# Patient Record
Sex: Male | Born: 1955 | Race: Black or African American | Hispanic: No | Marital: Married | Smoking: Former smoker
Health system: Southern US, Community
[De-identification: ages and names within clinical notes are randomized; demographics above are authoritative.]

## PROBLEM LIST (undated history)

## (undated) DIAGNOSIS — I10 Essential (primary) hypertension: Secondary | ICD-10-CM

---

## 2009-01-31 ENCOUNTER — Emergency Department (HOSPITAL_COMMUNITY): Admission: EM | Admit: 2009-01-31 | Discharge: 2009-02-01 | Payer: Self-pay | Admitting: Emergency Medicine

## 2009-02-07 ENCOUNTER — Ambulatory Visit (HOSPITAL_COMMUNITY): Admission: RE | Admit: 2009-02-07 | Discharge: 2009-02-07 | Payer: Self-pay | Admitting: Orthopedic Surgery

## 2010-02-20 ENCOUNTER — Emergency Department (HOSPITAL_COMMUNITY): Admission: EM | Admit: 2010-02-20 | Discharge: 2010-02-20 | Payer: Self-pay | Admitting: Family Medicine

## 2010-08-16 LAB — POCT I-STAT, CHEM 8
Creatinine, Ser: 1.2 mg/dL (ref 0.4–1.5)
HCT: 45 % (ref 39.0–52.0)
Hemoglobin: 15.3 g/dL (ref 13.0–17.0)
Sodium: 140 mEq/L (ref 135–145)
TCO2: 23 mmol/L (ref 0–100)

## 2010-09-07 LAB — CBC
MCHC: 35.1 g/dL (ref 30.0–36.0)
RBC: 4.03 MIL/uL — ABNORMAL LOW (ref 4.22–5.81)

## 2014-05-25 ENCOUNTER — Encounter (HOSPITAL_COMMUNITY): Payer: Self-pay | Admitting: Emergency Medicine

## 2014-05-25 ENCOUNTER — Emergency Department (HOSPITAL_COMMUNITY)
Admission: EM | Admit: 2014-05-25 | Discharge: 2014-05-25 | Disposition: A | Discharge status: Home / Self Care | Payer: Self-pay | Attending: Emergency Medicine | Admitting: Emergency Medicine

## 2014-05-25 ENCOUNTER — Emergency Department (HOSPITAL_COMMUNITY): Payer: Self-pay

## 2014-05-25 DIAGNOSIS — Z87891 Personal history of nicotine dependence: Secondary | ICD-10-CM | POA: Insufficient documentation

## 2014-05-25 DIAGNOSIS — S8991XA Unspecified injury of right lower leg, initial encounter: Secondary | ICD-10-CM | POA: Insufficient documentation

## 2014-05-25 DIAGNOSIS — Y998 Other external cause status: Secondary | ICD-10-CM | POA: Insufficient documentation

## 2014-05-25 DIAGNOSIS — Y9289 Other specified places as the place of occurrence of the external cause: Secondary | ICD-10-CM | POA: Insufficient documentation

## 2014-05-25 DIAGNOSIS — W01198A Fall on same level from slipping, tripping and stumbling with subsequent striking against other object, initial encounter: Secondary | ICD-10-CM | POA: Insufficient documentation

## 2014-05-25 DIAGNOSIS — Y9301 Activity, walking, marching and hiking: Secondary | ICD-10-CM | POA: Insufficient documentation

## 2014-05-25 DIAGNOSIS — I10 Essential (primary) hypertension: Secondary | ICD-10-CM | POA: Insufficient documentation

## 2014-05-25 DIAGNOSIS — M25569 Pain in unspecified knee: Secondary | ICD-10-CM

## 2014-05-25 HISTORY — DX: Essential (primary) hypertension: I10

## 2014-05-25 MED ORDER — NAPROXEN 500 MG PO TABS: 500.0000 mg | ORAL_TABLET | Freq: Two times a day (BID) | ORAL | Status: DC

## 2014-05-25 MED ORDER — HYDROCHLOROTHIAZIDE 25 MG PO TABS: 25.0000 mg | ORAL_TABLET | Freq: Every day | ORAL | Status: DC

## 2014-05-25 NOTE — ED Notes (Signed)
Pt to radiology.

## 2014-05-25 NOTE — ED Notes (Signed)
Pt ambulatory with slow steady gait to void in BR 

## 2014-05-25 NOTE — Discharge Instructions (Signed)
Knee Pain The knee is the complex joint between your thigh and your lower leg. It is made up of bones, tendons, ligaments, and cartilage. The bones that make up the knee are:  The femur in the thigh.  The tibia and fibula in the lower leg.  The patella or kneecap riding in the groove on the lower femur. CAUSES  Knee pain is a common complaint with many causes. A few of these causes are:  Injury, such as:  A ruptured ligament or tendon injury.  Torn cartilage.  Medical conditions, such as:  Gout  Arthritis  Infections  Overuse, over training, or overdoing a physical activity. Knee pain can be minor or severe. Knee pain can accompany debilitating injury. Minor knee problems often respond well to self-care measures or get well on their own. More serious injuries may need medical intervention or even surgery. SYMPTOMS The knee is complex. Symptoms of knee problems can vary widely. Some of the problems are:  Pain with movement and weight bearing.  Swelling and tenderness.  Buckling of the knee.  Inability to straighten or extend your knee.  Your knee locks and you cannot straighten it.  Warmth and redness with pain and fever.  Deformity or dislocation of the kneecap. DIAGNOSIS  Determining what is wrong may be very straight forward such as when there is an injury. It can also be challenging because of the complexity of the knee. Tests to make a diagnosis may include:  Your caregiver taking a history and doing a physical exam.  Routine X-rays can be used to rule out other problems. X-rays will not reveal a cartilage tear. Some injuries of the knee can be diagnosed by:  Arthroscopy a surgical technique by which a small video camera is inserted through tiny incisions on the sides of the knee. This procedure is used to examine and repair internal knee joint problems. Tiny instruments can be used during arthroscopy to repair the torn knee cartilage (meniscus).  Arthrography  is a radiology technique. A contrast liquid is directly injected into the knee joint. Internal structures of the knee joint then become visible on X-ray film.  An MRI scan is a non X-ray radiology procedure in which magnetic fields and a computer produce two- or three-dimensional images of the inside of the knee. Cartilage tears are often visible using an MRI scanner. MRI scans have largely replaced arthrography in diagnosing cartilage tears of the knee.  Blood work.  Examination of the fluid that helps to lubricate the knee joint (synovial fluid). This is done by taking a sample out using a needle and a syringe. TREATMENT The treatment of knee problems depends on the cause. Some of these treatments are:  Depending on the injury, proper casting, splinting, surgery, or physical therapy care will be needed.  Give yourself adequate recovery time. Do not overuse your joints. If you begin to get sore during workout routines, back off. Slow down or do fewer repetitions.  For repetitive activities such as cycling or running, maintain your strength and nutrition.  Alternate muscle groups. For example, if you are a weight lifter, work the upper body on one day and the lower body the next.  Either tight or weak muscles do not give the proper support for your knee. Tight or weak muscles do not absorb the stress placed on the knee joint. Keep the muscles surrounding the knee strong.  Take care of mechanical problems.  If you have flat feet, orthotics or special shoes may help.  See your caregiver if you need help. °· Arch supports, sometimes with wedges on the inner or outer aspect of the heel, can help. These can shift pressure away from the side of the knee most bothered by osteoarthritis. °· A brace called an "unloader" brace also may be used to help ease the pressure on the most arthritic side of the knee. °· If your caregiver has prescribed crutches, braces, wraps or ice, use as directed. The acronym  for this is PRICE. This means protection, rest, ice, compression, and elevation. °· Nonsteroidal anti-inflammatory drugs (NSAIDs), can help relieve pain. But if taken immediately after an injury, they may actually increase swelling. Take NSAIDs with food in your stomach. Stop them if you develop stomach problems. Do not take these if you have a history of ulcers, stomach pain, or bleeding from the bowel. Do not take without your caregiver's approval if you have problems with fluid retention, heart failure, or kidney problems. °· For ongoing knee problems, physical therapy may be helpful. °· Glucosamine and chondroitin are over-the-counter dietary supplements. Both may help relieve the pain of osteoarthritis in the knee. These medicines are different from the usual anti-inflammatory drugs. Glucosamine may decrease the rate of cartilage destruction. °· Injections of a corticosteroid drug into your knee joint may help reduce the symptoms of an arthritis flare-up. They may provide pain relief that lasts a few months. You may have to wait a few months between injections. The injections do have a small increased risk of infection, water retention, and elevated blood sugar levels. °· Hyaluronic acid injected into damaged joints may ease pain and provide lubrication. These injections may work by reducing inflammation. A series of shots may give relief for as long as 6 months. °· Topical painkillers. Applying certain ointments to your skin may help relieve the pain and stiffness of osteoarthritis. Ask your pharmacist for suggestions. Many over the-counter products are approved for temporary relief of arthritis pain. °· In some countries, doctors often prescribe topical NSAIDs for relief of chronic conditions such as arthritis and tendinitis. A review of treatment with NSAID creams found that they worked as well as oral medications but without the serious side effects. °PREVENTION °· Maintain a healthy weight. Extra pounds  put more strain on your joints. °· Get strong, stay limber. Weak muscles are a common cause of knee injuries. Stretching is important. Include flexibility exercises in your workouts. °· Be smart about exercise. If you have osteoarthritis, chronic knee pain or recurring injuries, you may need to change the way you exercise. This does not mean you have to stop being active. If your knees ache after jogging or playing basketball, consider switching to swimming, water aerobics, or other low-impact activities, at least for a few days a week. Sometimes limiting high-impact activities will provide relief. °· Make sure your shoes fit well. Choose footwear that is right for your sport. °· Protect your knees. Use the proper gear for knee-sensitive activities. Use kneepads when playing volleyball or laying carpet. Buckle your seat belt every time you drive. Most shattered kneecaps occur in car accidents. °· Rest when you are tired. °SEEK MEDICAL CARE IF:  °You have knee pain that is continual and does not seem to be getting better.  °SEEK IMMEDIATE MEDICAL CARE IF:  °Your knee joint feels hot to the touch and you have a high fever. °MAKE SURE YOU:  °· Understand these instructions. °· Will watch your condition. °· Will get help right away if you are not   doing well or get worse. Document Released: 03/17/2007 Document Revised: 08/12/2011 Document Reviewed: 03/17/2007 [facility] Patient Information 2015 [place], [place]. This information is not intended to replace advice given to you by your health care provider. Make sure you discuss any questions you have with your health care provider.  Hypertension Hypertension, commonly called high blood pressure, is when the force of blood pumping through your arteries is too strong. Your arteries are the blood vessels that carry blood from your heart throughout your body. A blood pressure reading consists of a higher number over a lower number, such as 110/72. The higher number  (systolic) is the pressure inside your arteries when your heart pumps. The lower number (diastolic) is the pressure inside your arteries when your heart relaxes. Ideally you want your blood pressure below 120/80. Hypertension forces your heart to work harder to pump blood. Your arteries may become narrow or stiff. Having hypertension puts you at risk for heart disease, stroke, and other problems.  RISK FACTORS Some risk factors for high blood pressure are controllable. Others are not.  Risk factors you cannot control include:   Race. You may be at higher risk if you are African American.  Age. Risk increases with age.  Gender. Men are at higher risk than women before age 57 years. After age 565, women are at higher risk than men. Risk factors you can control include:  Not getting enough exercise or physical activity.  Being overweight.  Getting too much fat, sugar, calories, or salt in your diet.  Drinking too much alcohol. SIGNS AND SYMPTOMS Hypertension does not usually cause signs or symptoms. Extremely high blood pressure (hypertensive crisis) may cause headache, anxiety, shortness of breath, and nosebleed. DIAGNOSIS  To check if you have hypertension, your health care provider will measure your blood pressure while you are seated, with your arm held at the level of your heart. It should be measured at least twice using the same arm. Certain conditions can cause a difference in blood pressure between your right and left arms. A blood pressure reading that is higher than normal on one occasion does not mean that you need treatment. If one blood pressure reading is high, ask your health care provider about having it checked again. TREATMENT  Treating high blood pressure includes making lifestyle changes and possibly taking medicine. Living a healthy lifestyle can help lower high blood pressure. You may need to change some of your habits. Lifestyle changes may include:  Following the DASH  diet. This diet is high in fruits, vegetables, and whole grains. It is low in salt, red meat, and added sugars.  Getting at least 2 hours of brisk physical activity every week.  Losing weight if necessary.  Not smoking.  Limiting alcoholic beverages.  Learning ways to reduce stress. If lifestyle changes are not enough to get your blood pressure under control, your health care provider may prescribe medicine. You may need to take more than one. Work closely with your health care provider to understand the risks and benefits. HOME CARE INSTRUCTIONS  Have your blood pressure rechecked as directed by your health care provider.   Take medicines only as directed by your health care provider. Follow the directions carefully. Blood pressure medicines must be taken as prescribed. The medicine does not work as well when you skip doses. Skipping doses also puts you at risk for problems.   Do not smoke.   Monitor your blood pressure at home as directed by your health care provider.  SEEK MEDICAL CARE IF:   You think you are having a reaction to medicines taken.  You have recurrent headaches or feel dizzy.  You have swelling in your ankles.  You have trouble with your vision. SEEK IMMEDIATE MEDICAL CARE IF:  You develop a severe headache or confusion.  You have unusual weakness, numbness, or feel faint.  You have severe chest or abdominal pain.  You vomit repeatedly.  You have trouble breathing. MAKE SURE YOU:   Understand these instructions.  Will watch your condition.  Will get help right away if you are not doing well or get worse. Document Released: 05/20/2005 Document Revised: 10/04/2013 Document Reviewed: 03/12/2013 [facility] Patient Information 2015 [place]Care, [place]. This information is not intended to replace advice given to you by your health care provider. Make sure you discuss any questions you have with your health care provider.   Emergency Department Resource  Guide 1) Find a Doctor and Pay Out of Pocket Although you won't have to find out who is covered by your insurance plan, it is a good idea to ask around and get recommendations. You will then need to call the office and see if the doctor you have chosen will accept you as a new patient and what types of options they offer for patients who are self-pay. Some doctors offer discounts or will set up payment plans for their patients who do not have insurance, but you will need to ask so you aren't surprised when you get to your appointment.  2) Contact Your Local Health Department Not all health departments have doctors that can see patients for sick visits, but many do, so it is worth a call to see if yours does. If you don't know where your local health department is, you can check in your phone book. The CDC also has a tool to help you locate your state's health department, and many state websites also have listings of all of their local health departments.  3) Find a Walk-in Clinic If your illness is not likely to be very severe or complicated, you may want to try a walk in clinic. These are popping up all over the country in pharmacies, drugstores, and shopping centers. They're usually staffed by nurse practitioners or physician assistants that have been trained to treat common illnesses and complaints. They're usually fairly quick and inexpensive. However, if you have serious medical issues or chronic medical problems, these are probably not your best option.  No Primary Care Doctor: - Call Health Connect at  [phone] - they can help you locate a primary care doctor that  accepts your insurance, provides certain services, etc. - Physician Referral Service- [phone]  Chronic Pain Problems: Organization         Address  Phone   Notes  [facility]  [phone] Patients need to be referred by their primary care doctor.   Medication Assistance: Organization          Address  Phone   Notes  [facility] [address] [place], [place] [phone] --Must be a resident of [facility] [place] -- Must have NO insurance coverage whatsoever (no Medicaid/ Medicare, etc.) -- The pt. MUST have a primary care doctor that directs their care regularly and follows them in the community   Med[facility]ist  [phone]   [employer]  [phone]    Agencies that provide inexpensive medical care: Organization  Address  Phone   Notes  [place]  [phone]   [facility]    [phone]   [facility] [place], [place] [address] [phone]   [place] [address] [place] [place] [phone]   Planned Parenthood    [phone]   [facility]    [phone]   [place] and [place] [address], [place] Phone:  [phone], Fax:  [phone] Hours of Operation:  9 am - 6 pm, M-F.  Also accepts Medicaid/Medicare and self-pay.  [facility] for [place], [address], [place] Phone: [phone], Fax: [phone]. Hours of Operation:  8:30 am - 5:30 pm, M-F.  Also accepts Medicaid and self-pay.  [facility] [address] [place] Phone: [phone]   [place], [place], [place] [phone], Ext. [phone] Mondays & Thursdays: 7-9 AM.  First 15 patients are seen on a first come, first serve basis.    [place] Providers:  Organization         Address  Phone   Notes  [facility] [address], [place] [phone] Also accepts self-pay patients.  [facility] [address], [place]  [phone]   [place], [address], [place] [phone]   [facility] [address] [place] [phone]   Lucianne Lei [address], [place]   [phone] Only accepts [place] Access [place] patients after they have their name applied to their card.   Self-Pay (no insurance) in [facility]:  Organization         Address  Phone   Notes  Sickle Cell Patients, [facility] [place] [phone]   [facility] [place] [phone]   [facility] [place]  [place], [place], [place] [phone]   [facility]/Dr. Osei-Bonsu  [address] [place] or [address], [place] [phone] Phone number for both [place] and [place] locations is the same.  [facility] [address] [place] [phone]   [facility] [address] [place] or [address] [phone]   [facility] [address] [place] [phone], phone; [phone], fax Sees patients 1st and 3rd Saturday of every month.  Must not qualify for public or private insurance (i.e. Medicaid, Medicare, [place] Health Choice, Veterans' Benefits)  Household income should be no more than 200% of the poverty level The clinic cannot treat you if you are pregnant or think you are pregnant  Sexually transmitted diseases are not treated at the clinic.    Dental Care: Organization         Address  Phone  Notes  [facility] of [facility] [phone] Accepts children up to age 52 who are enrolled in [place] or [place]; pregnant women with a Medicaid card; and children who have applied for Medicaid or [place] Health Choice, but were declined, whose parents can pay a reduced fee at time of  service.  [facility]  [address], [place] [phone] Accepts children up to age 6 who are enrolled in [place] or [place]; pregnant women with a Medicaid card; and children who have applied for Medicaid or [place] Health Choice, but were declined, whose parents can pay a reduced fee at time of service.  [facility] Access PRO[facility]  [place] [phone] Patients are seen by appointment only. Walk-ins are not accepted. [place] will see patients 26 years of age and older. Monday - Tuesday (8am-5pm) Most Wednesdays (8:30-5pm) $30 per visit, cash only  [facility] Adult Dental Access PROGRAM  [address], [facility] [phone] Patients are seen by appointment only. Walk-ins are not accepted. [place] will see patients 72 years of age and older. One Wednesday Evening (Monthly: Volunteer Based).  $30 per visit, cash only  [place]  [phone] for adults; Children under age 9, call Graduate Pediatric Dentistry at [phone]. Children aged 28-14, please call [phone] to request a pediatric application.  Dental services are provided in all areas of dental care including fillings, crowns and bridges, complete and partial dentures, implants, gum treatment, root canals, and extractions. Preventive care is also provided. Treatment is provided to both adults and children. Patients are selected via a lottery and there is often a waiting list.   [facility] [address], [place]  [phone] [email or web address]   [facility] [address] [place], [place] [phone], Ext. [phone] Second and Fourth Thursday of each month, opens at 6:30 AM; Clinic ends at 9 AM.  Patients are seen on a first-come first-served basis, and a limited number are seen during each clinic.   [facility]  [address] Hillard Danker [place], [place] [phone]   Eligibility Requirements You must  have lived in [place], [place], or [place] counties for at least the last three months.   You cannot be eligible for state or federal sponsored [employer], including [employer], [place], or [employer].   You generally cannot be eligible for healthcare insurance through your employer.    How to apply: Eligibility screenings are held every Tuesday and Wednesday afternoon from 1:00 pm until 4:00 pm. You do not need an appointment for the interview!  [facility] [address], [place], [place]   [place]  [place] Department  [place]  [phone]    Behavioral Health Resources in the Community: Intensive Outpatient Programs Organization         Address  Phone  Notes  [place]. [address], [place], [place] [phone]   [facility] [address], [place], [place]   ADS: [facility] S[facility] [address] [place], [place]   [place] [address],  [place], [place] or [phone]   Substance Abuse Resources Organization         Address  Phone  Notes  Alcohol and Drug Services  [phone]   [place]  [phone]   [place]  [phone]   Chinita Pester  [phone]   Residential & Outpatient Substance Abuse Program  [phone]   Psychological Services Organization         Address  Phone  Notes  [facility]  [phone]   [facility] Services  [phone]   [facility] Mental Health [address], [place] [phone] or [phone]    Mobile Crisis Teams Organization         Address  Phone  Notes  [facility], Mobile Crisis Care Unit  [phone]   [facility]  [address] [place], [place] [phone]   Doristine Locks [address] [place] [phone]    Self-Help/Support Groups Organization         Address  Phone             Notes  [facility] of [place] - variety of support groups  [phone]- I7437963 Call for more information  Narcotics Anonymous (NA), [facility]aring [facility] [address], [place]  2 meetings at this location   Statistician         Address  Phone  Notes  [facility] [address],    [place]  [phone]   [facility]  [address] [place] [address], [place], [place] [phone]   [facility] [address] [place], [place] [phone] Admissions: 8am-3pm M-F  [facility] [address],    [place], [place] [phone]   [facility] [address] [place], [place], [place] [phone]   [facility] [address],  [place], [place] [phone]   [facility] - Intensive Outpatient [address]ure[address], [place], [place] [phone]   [facility] ([facility] [address] [place]  [place], [place] [phone] or [phone]   [facility] ([facility]) [address], [place], [place] [phone] Accepts Medicaid  [facility] [place] [address],  [place] [phone] Substance Abuse/Addiction Treatment   [facility] Organization         Address  Phone  Notes  [facility]  [phone]   Angie Fava, PhD [address] Ervin Knack [place], [place]   [phone] or [phone]   [facility]   [address] [place], [place] [phone]   [facility] [address] [place], [place] [phone] Insurance/Medicaid/sponsorship through [facility] and Families [address]                                    [place], [place] [phone] Therapy/tele-psych/case  [facility] [address], [place] [phone]    Dr. Lolly Mustache  [phone]   [facility]  United [employer] [facility] Dept. 1) [address] [place] 2) [address] [place] 3)  [address], [place] [phone]  [phone]   [place] Day Child Abuse Hotline (919)831-1659 or 7472809546 (After Hours)

## 2014-05-25 NOTE — ED Provider Notes (Signed)
CSN: [id number]     Arrival date & time 05/25/14  1733 History   First MD Initiated Contact with Patient 05/25/14 1736     Chief Complaint  Patient presents with  . Fall    slipped on grass outside, felt a pop in lateral aspect R quad  . Leg Pain    R lateral thigh   HPI  Patient is a 58 year old male with past medical history of hypertension which is not currently being treated who presents emergency room for evaluation of right leg pain. Patient states that he was walking outside when he slipped and fell and landed with his knee bent underneath him. He denies hitting his head. He denies loss of consciousness. Patient states that he felt a pop in his knee. He was unable to get himself up and had to call EMS. Patient states he has tried no relieving factors at this time. Patient has not been able to ambulate since falling. He feels that the pain was a 4 out of 10 initially. He feels that his pain is now easing. Patient states this feels similar to when his kneecap on his left leg is to dislocate. He feels that the kneecap is in appropriate position at this time. EMS reported that the patient was very hypertensive when he picked him up. Patient states that he is to be on medication but his hypertension went away. Patient is not having any chest pain, shortness breath, headache, changes in vision, or difficulty with urinating. Patient does not currently have a PCP.  Past Medical History  Diagnosis Date  . Hypertension    History reviewed. No pertinent past surgical history. No family history on file. History  Substance Use Topics  . Smoking status: Former Games developer  . Smokeless tobacco: Not on file  . Alcohol Use: Yes     Comment: socially    Review of Systems  Eyes: Negative for visual disturbance.  Respiratory: Negative for chest tightness and shortness of breath.   Cardiovascular: Negative for chest pain, palpitations and leg swelling.  Gastrointestinal: Negative for nausea and  vomiting.  Musculoskeletal: Positive for joint swelling, arthralgias and gait problem.  Neurological: Negative for weakness, light-headedness and headaches.  All other systems reviewed and are negative.     Allergies  Review of patient's allergies indicates no known allergies.  Home Medications   Prior to Admission medications   Medication Sig Start Date End Date Taking? Authorizing Provider  hydrochlorothiazide (HYDRODIURIL) 25 MG tablet Take 1 tablet (25 mg total) by mouth daily. 05/25/14   Marisol Giambra A Forcucci, PA-C  naproxen (NAPROSYN) 500 MG tablet Take 1 tablet (500 mg total) by mouth 2 (two) times daily. 05/25/14   Trayon Krantz A Forcucci, PA-C   BP 242/116 mmHg  Pulse 60  Temp(Src) 98.7 F (37.1 C) (Oral)  Resp 18  Ht 5\' 7"  (1.702 m)  Wt 210 lb (95.255 kg)  BMI 32.88 kg/m2  SpO2 99% Physical Exam  Constitutional: He is oriented to person, place, and time. He appears well-developed and well-nourished. No distress.  HENT:  Head: Normocephalic and atraumatic.  Mouth/Throat: Oropharynx is clear and moist. No oropharyngeal exudate.  Eyes: Conjunctivae and EOM are normal. Pupils are equal, round, and reactive to light. No scleral icterus.  Neck: Normal range of motion. Neck supple. No JVD present. No thyromegaly present.  Cardiovascular: Normal rate, regular rhythm, normal heart sounds and intact distal pulses.  Exam reveals no gallop and no friction rub.   No murmur heard. Pulses:  Dorsalis pedis pulses are 2+ on the right side, and 2+ on the left side.       Posterior tibial pulses are 2+ on the right side, and 2+ on the left side.  Pulmonary/Chest: Effort normal and breath sounds normal. No respiratory distress. He has no wheezes. He has no rales. He exhibits no tenderness.  Musculoskeletal:       Right knee: He exhibits swelling and effusion. He exhibits normal range of motion, no ecchymosis, no deformity, no laceration, no erythema, normal alignment, no LCL laxity,  normal patellar mobility, no bony tenderness, normal meniscus and no MCL laxity. No tenderness found. No medial joint line, no lateral joint line, no MCL, no LCL and no patellar tendon tenderness noted.  Patient able to bend and extend knee on his own. Patient able to ambulate with a mildly antalgic gait to the right. There is tenderness to palpation over the suprapatellar pouch. There are no visible deformities. Positive Ballot sign. Negative McMurray's. Negative anterior and posterior drawer.  Lymphadenopathy:    He has no cervical adenopathy.  Neurological: He is alert and oriented to person, place, and time.  Skin: Skin is warm and dry. He is not diaphoretic.  Psychiatric: He has a normal mood and affect. His behavior is normal. Judgment and thought content normal.  Nursing note and vitals reviewed.   ED Course  Procedures (including critical care time) Labs Review Labs Reviewed - No data to display  Imaging Review Dg Femur Right  05/25/2014   CLINICAL DATA:  Slipped and fell, right leg pain  EXAM: RIGHT FEMUR - 2 VIEW  COMPARISON:  None.  FINDINGS: There is no evidence of fracture or other focal bone lesions. Soft tissues are unremarkable.  IMPRESSION: Negative.   Electronically Signed   By: Christiana PellantGretchen  Green M.D.   On: 05/25/2014 18:53   Dg Knee Complete 4 Views Right  05/25/2014   CLINICAL DATA:  Patient slipped and fell at the daughter's house today. Right knee swelling. Initial encounter.  EXAM: RIGHT KNEE - COMPLETE 4+ VIEW  COMPARISON:  None.  FINDINGS: There is no acute fracture or dislocation. There is a moderate joint effusion. There is no evidence of arthropathy or other focal bone abnormality. Soft tissues are unremarkable.  IMPRESSION: 1. No acute osseous injury of the right knee. 2. Moderate joint effusion.   Electronically Signed   By: Elige KoHetal  Patel   On: 05/25/2014 18:53     EKG Interpretation None      MDM   Final diagnoses:  Knee pain  Essential hypertension    Patient is a 58 year old male who presents emergency room for evaluation of right knee pain after a fall. Physical exam reveals neurovascularly intact right leg with a knee effusion. There is no visible deformities. Plain film x-rays are negative. Suspect that this may be possible injury to meniscus versus ligaments. We will start patient on daily anti-inflammatories medications, I have urged continued rice therapy. Will give patient crutches straight leg immobilizer. Patient continues to have problems with his knee I recommend following up with orthopedics. Patient is very hypertensive in the emergency room. He is asymptomatic at this time. I will start the patient on hydrochlorothiazide 25 mg. Patient to follow-up with a PCP of his choosing in 2 weeks. I stressed the entire nature of him following up. Patient to return for chest pain, shortness breath, sudden onset of worst headache of his life, changes in his vision, or any other concerning symptoms. Patient states understanding  and agreement at this time. Wife also states understanding and agreement with the above plan.    Eben Burowourtney A Forcucci, PA-C 05/25/14 1911  Gilda Creasehristopher J. Pollina, MD 05/25/14 [phone]

## 2014-05-25 NOTE — ED Notes (Signed)
EDPA aware of BP 

## 2014-05-25 NOTE — ED Notes (Signed)
Per EMS - pt from home with c/o slip and fall outside, denies hitting head or LOC.  Reports feeling a pop in R lateral thigh area.  Nonambulatory per EMS.

## 2014-05-25 NOTE — Progress Notes (Signed)
CSW met with patient at bedside. There was no family present. Patient states that while at his daughters house today, he slipped and fell because of slippery grass. Patient informed CSW that he is not able to elevate his leg. Patient says that before the incident he was able to walk great and with no issues. Patient states that he is able to complete his ADL's independently at home. Patient informed CSW that he lives at home with his wife in [place].   Willette Brace [phone] ED CSW  05/25/2014 6:36 PM

## 2014-05-25 NOTE — ED Notes (Signed)
Ortho tech aware of orders.  

## 2015-05-22 ENCOUNTER — Other Ambulatory Visit (HOSPITAL_COMMUNITY): Payer: Self-pay | Admitting: Internal Medicine

## 2015-05-22 DIAGNOSIS — H469 Unspecified optic neuritis: Secondary | ICD-10-CM

## 2015-05-30 ENCOUNTER — Other Ambulatory Visit (HOSPITAL_COMMUNITY): Payer: Self-pay | Admitting: Internal Medicine

## 2015-06-19 ENCOUNTER — Ambulatory Visit (HOSPITAL_COMMUNITY): Attending: Internal Medicine

## 2015-06-19 ENCOUNTER — Ambulatory Visit (HOSPITAL_COMMUNITY): Admission: RE | Admit: 2015-06-19 | Source: Ambulatory Visit

## 2015-10-17 ENCOUNTER — Other Ambulatory Visit (HOSPITAL_COMMUNITY)

## 2015-10-17 ENCOUNTER — Ambulatory Visit (HOSPITAL_COMMUNITY)

## 2015-10-23 ENCOUNTER — Ambulatory Visit: Attending: Internal Medicine

## 2016-02-21 ENCOUNTER — Encounter (HOSPITAL_COMMUNITY): Payer: Self-pay | Admitting: *Deleted

## 2016-02-21 DIAGNOSIS — H109 Unspecified conjunctivitis: Secondary | ICD-10-CM | POA: Insufficient documentation

## 2016-02-21 DIAGNOSIS — H547 Unspecified visual loss: Secondary | ICD-10-CM | POA: Insufficient documentation

## 2016-02-21 DIAGNOSIS — Z87891 Personal history of nicotine dependence: Secondary | ICD-10-CM | POA: Insufficient documentation

## 2016-02-21 DIAGNOSIS — I10 Essential (primary) hypertension: Secondary | ICD-10-CM | POA: Insufficient documentation

## 2016-02-21 NOTE — ED Triage Notes (Addendum)
Pt in via [facility] c/o bilateral eye pain that started while at work, unknown exposure, as day progressed pain increased and sensitivity to light, bilateral eye redness on arrival, alert and oriented, pt reports he irrigated his eyes at work without relief

## 2016-02-22 ENCOUNTER — Emergency Department (HOSPITAL_COMMUNITY)
Admission: EM | Admit: 2016-02-22 | Discharge: 2016-02-22 | Disposition: A | Discharge status: Home / Self Care | Attending: Emergency Medicine | Admitting: Emergency Medicine

## 2016-02-22 DIAGNOSIS — H543 Unqualified visual loss, both eyes: Secondary | ICD-10-CM

## 2016-02-22 DIAGNOSIS — H109 Unspecified conjunctivitis: Secondary | ICD-10-CM

## 2016-02-22 MED ORDER — OFLOXACIN 0.3 % OP SOLN
1.0000 [drp] | Freq: Four times a day (QID) | OPHTHALMIC | Status: DC
  Filled 2016-02-22: qty 5

## 2016-02-22 MED ORDER — HYDROCHLOROTHIAZIDE 25 MG PO TABS: 25.0000 mg | ORAL_TABLET | Freq: Every day | ORAL | 1 refills | Status: DC

## 2016-02-22 MED ORDER — TETRACAINE HCL 0.5 % OP SOLN
2.0000 [drp] | Freq: Once | OPHTHALMIC | Status: AC
  Administered 2016-02-22: 2 [drp] via OPHTHALMIC
  Filled 2016-02-22: qty 2

## 2016-02-22 MED ORDER — FLUORESCEIN SODIUM 1 MG OP STRP
2.0000 | ORAL_STRIP | Freq: Once | OPHTHALMIC | Status: AC
  Administered 2016-02-22: 2 via OPHTHALMIC
  Filled 2016-02-22: qty 2

## 2016-02-22 MED FILL — HYDROCHLOROTHIAZIDE 25 MG T: 25 | 30 days supply | Qty: 30 | Fill #0

## 2016-02-22 NOTE — ED Notes (Signed)
MD at bedside. 

## 2016-02-22 NOTE — ED Provider Notes (Signed)
[facility]-EMERGENCY DEPT Provider Note   CSN: [id number] Arrival date & time: 02/21/16  2158     History   Chief Complaint Chief Complaint  Patient presents with  . Eye Pain    HPI Marc Harris is a 60 y.o. male.  HPI Marc Harris is a 60 y.o. male with hx of htn, presents to ED with complaint of bilateral eye pain. Pt states pain started yesterday while at work. 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 Pt states initially felt some irritation to bilateral eyes and Sensitivity to light. He reports some blurred vision. As the day went on, patient states his blurred vision became worse. He reports pain to bilateral eyes and mild drainage. Patient has been in the waiting room all night, and states he feels like his eyes are feeling better. He denies any injuries to his eyes. He denies any prior similar episodes. He states he has some blurred vision in the left eye which is not new. He does not wear glasses or contacts. He has not been exposed to any bright lights. He does not have an ophthalmologist.   Past Medical History:  Diagnosis Date  . Hypertension     There are no active problems to display for this patient.   History reviewed. No pertinent surgical history.     Home Medications    Prior to Admission medications   Medication Sig Start Date End Date Taking? Authorizing Provider  hydrochlorothiazide (HYDRODIURIL) 25 MG tablet Take 1 tablet (25 mg total) by mouth daily. 05/25/14   Courtney Forcucci, PA-C  naproxen (NAPROSYN) 500 MG tablet Take 1 tablet (500 mg total) by mouth 2 (two) times daily. 05/25/14   Terri Piedraourtney Forcucci, PA-C    Family History History reviewed. No pertinent family history.  Social History Social History  Substance Use Topics  . Smoking status: Former Games developermoker  . Smokeless tobacco: Not on file  . Alcohol use Yes     Comment: socially     Allergies   Review of patient's allergies indicates no known allergies.   Review of Systems Review of Systems    Constitutional: Negative for chills and fever.  Eyes: Positive for photophobia, pain, discharge, redness and visual disturbance.  Respiratory: Negative for cough, chest tightness and shortness of breath.   Cardiovascular: Negative for chest pain, palpitations and leg swelling.  Skin: Negative for rash.  Allergic/Immunologic: Negative for immunocompromised state.  Neurological: Negative for dizziness, weakness, light-headedness, numbness and headaches.     Physical Exam Updated Vital Signs BP 165/69 (BP Location: Right Arm)   Pulse (!) 57   Temp 98.2 F (36.8 C) (Oral)   Resp 20   Ht 5\' 7"  (1.702 m)   Wt 90.7 kg   SpO2 95%   BMI 31.32 kg/m   Physical Exam  Constitutional: He appears well-developed and well-nourished. No distress.  Eyes: EOM and lids are normal. Lids are everted and swept, no foreign bodies found. Right eye exhibits discharge. Left eye exhibits discharge. Right conjunctiva is injected. Right conjunctiva has no hemorrhage. Left conjunctiva is injected. Left conjunctiva has no hemorrhage.  Slit lamp exam:      The right eye shows no corneal abrasion, no corneal ulcer, no foreign body and no hyphema.       The left eye shows no corneal abrasion, no corneal ulcer, no foreign body and no hyphema.  Neck: Neck supple.  Cardiovascular: Normal rate.   Pulmonary/Chest: No respiratory distress.  Abdominal: He exhibits no distension.  Skin: Skin is warm  and dry.  Nursing note and vitals reviewed.    ED Treatments / Results  Labs (all labs ordered are listed, but only abnormal results are displayed) Labs Reviewed - No data to display  EKG  EKG Interpretation None       Radiology No results found.  Procedures Procedures (including critical care time)  Medications Ordered in ED Medications  tetracaine (PONTOCAINE) 0.5 % ophthalmic solution 2 drop (not administered)  fluorescein ophthalmic strip 2 strip (not administered)     Initial Impression /  Assessment and Plan / ED Course  I have reviewed the triage vital signs and the nursing notes.  Pertinent labs & imaging results that were available during my care of the patient were reviewed by me and considered in my medical decision making (see chart for details).  Clinical Course     Final Clinical Impressions(s) / ED Diagnoses   Final diagnoses:  Bilateral conjunctivitis  Decreased vision in both eyes   Pt with bilateral eye swelling, erythema, drainage. Uknown contact with any chemicals. 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 Pt with bilateral erythema, drainage from eyes, photophobia. Normal pressures, negative fluorescein stain. Will check visual acuity, uanble to do earlier bc unable to open eyes.    7:47 AM Pt initially told us that he could see back to baseline. When he was taken to get visual acuity, his pain came back and he reported blurred vision. Will try tetracaine drops again and reassess visual acuity.   8:05 AM Pt's pain improved with tetracaine drops. I rechecked visual acuity myself. Right eye 20/70, left 20/200. Pt reports chronic left eye vision problems. Will dc home with ocuflox and follow up with ophthalmology.   New Prescriptions New Prescriptions   No medications on file     Jaynie Crumble, PA-C 02/22/16 8469    Jaynie Crumble, PA-C 02/22/16 6295    Gilda Crease, MD 03/01/16 [phone]

## 2016-02-22 NOTE — Discharge Instructions (Signed)
Apply eye drops 1 drop to each eye every 6 hrs. Follow up with eye doctor.

## 2018-08-12 ENCOUNTER — Ambulatory Visit: Admitting: Family Medicine

## 2019-09-24 ENCOUNTER — Encounter (HOSPITAL_COMMUNITY): Payer: Self-pay

## 2019-09-24 ENCOUNTER — Emergency Department (HOSPITAL_COMMUNITY)
Admission: EM | Admit: 2019-09-24 | Discharge: 2019-09-24 | Disposition: A | Discharge status: Home / Self Care | Payer: Self-pay | Attending: Emergency Medicine | Admitting: Emergency Medicine

## 2019-09-24 ENCOUNTER — Emergency Department (HOSPITAL_COMMUNITY): Payer: Self-pay

## 2019-09-24 ENCOUNTER — Other Ambulatory Visit: Payer: Self-pay

## 2019-09-24 DIAGNOSIS — M79671 Pain in right foot: Secondary | ICD-10-CM | POA: Insufficient documentation

## 2019-09-24 DIAGNOSIS — I10 Essential (primary) hypertension: Secondary | ICD-10-CM | POA: Insufficient documentation

## 2019-09-24 DIAGNOSIS — Z87891 Personal history of nicotine dependence: Secondary | ICD-10-CM | POA: Insufficient documentation

## 2019-09-24 MED ORDER — NAPROXEN 375 MG PO TABS: 375.0000 mg | ORAL_TABLET | Freq: Two times a day (BID) | ORAL | 0 refills | Status: AC

## 2019-09-24 NOTE — Discharge Instructions (Addendum)
You may alternate taking Tylenol and Naproxen as needed for pain control. You may take Naproxen twice daily as directed on your discharge paperwork and you may take  [phone] mg of Tylenol every 6 hours. Do not exceed 4000 mg of Tylenol daily as this can lead to liver damage. Also, make sure to take Naproxen with meals as it can cause an upset stomach. Do not take other NSAIDs while taking Naproxen such as (Aleve, Ibuprofen, Aspirin, Celebrex, etc) and do not take more than the prescribed dose as this can lead to ulcers and bleeding in your GI tract. You may use warm and cold compresses to help with your symptoms.   Try to rest the foot as much as possible over the next few days.   Please follow up with the orthopedic doctor within the next 7-10 days for re-evaluation and further treatment of your symptoms.   Please return to the ER sooner if you have any new or worsening symptoms.

## 2019-09-24 NOTE — ED Triage Notes (Signed)
Pt reports Right foot pain x1 week, states he's unable to walk on it. Pt reports Right knee injury in 2015.

## 2019-09-24 NOTE — Progress Notes (Signed)
Orthopedic Tech Progress Note Patient Details:  Bowdy Bair [birth year] [id number]  Ortho Devices Type of Ortho Device: Crutches, Postop shoe/boot Ortho Device/Splint Location: RLE Ortho Device/Splint Interventions: Ordered, Application   Post Interventions Patient Tolerated: Ambulated well, Well Instructions Provided: Poper ambulation with device, Care of device   Donald Pore 09/24/2019, 10:41 AM

## 2019-09-24 NOTE — ED Provider Notes (Signed)
[facility] EMERGENCY DEPARTMENT Provider Note   CSN: [id number] Arrival date & time: 09/24/19  9622     History Chief Complaint  Patient presents with  . Foot Pain    Marc Harris is a 64 y.o. male.  HPI   Pt is a 64 y/o male with a h/o HTN, who presents to the ED today for eval of right foot pain. States pain is located to the 1st MTP and over the top of the forefoot. Pain has been intermittent for the last week. Rates pain 8/10. Pain is worse with ambulation and worse at the end of his work day.  He tried taking a pain pill a few days ago that helped him sleep. He denies any recent falls or trauma. He feels like he has some swelling. Denies fevers or redness to the foot.   States he is on his feet for 8-10 hours per day at his job.   Past Medical History:  Diagnosis Date  . Hypertension     There are no problems to display for this patient.   History reviewed. No pertinent surgical history.     History reviewed. No pertinent family history.  Social History   Tobacco Use  . Smoking status: Former Games developer  . Smokeless tobacco: Never Used  Substance Use Topics  . Alcohol use: Yes    Comment: socially  . Drug use: Not on file    Home Medications Prior to Admission medications   Medication Sig Start Date End Date Taking? Authorizing Provider  naproxen (NAPROSYN) 375 MG tablet Take 1 tablet (375 mg total) by mouth 2 (two) times daily for 7 days. 09/24/19 10/01/19  Shaul Trautman S, PA-C    Allergies    Patient has no known allergies.  Review of Systems   Review of Systems  Constitutional: Negative for fever.  Musculoskeletal:       Right foot pain  Skin: Negative for color change.  Neurological: Negative for weakness and numbness.    Physical Exam Updated Vital Signs BP 140/90 (BP Location: Left Arm)   Pulse 97   Temp 97.9 F (36.6 C) (Oral)   Resp 16   Ht 5\' 7"  (1.702 m)   Wt 90.7 kg   SpO2 98%   BMI 31.32 kg/m   Physical  Exam Constitutional:      General: He is not in acute distress.    Appearance: He is well-developed.  Eyes:     Conjunctiva/sclera: Conjunctivae normal.  Cardiovascular:     Rate and Rhythm: Normal rate and regular rhythm.  Pulmonary:     Effort: Pulmonary effort is normal.     Breath sounds: Normal breath sounds.  Musculoskeletal:     Comments: TTP to the dorsum and the plantar surface of the forefoot. Also with TTP to the 1st lateral MTP however there is no swelling, TTP, or erythema to the joint. Brisk cap refill distally. Normal sensation and normal DP pulse.   Skin:    General: Skin is warm and dry.  Neurological:     Mental Status: He is alert and oriented to person, place, and time.     ED Results / Procedures / Treatments   Labs (all labs ordered are listed, but only abnormal results are displayed) Labs Reviewed - No data to display  EKG None  Radiology DG Foot Complete Right  Result Date: 09/24/2019 CLINICAL DATA:  Right foot pain for 1 week. No known trauma. EXAM: RIGHT FOOT COMPLETE - 3+  VIEW COMPARISON:  None. FINDINGS: The joint spaces are maintained. No acute bony findings. No bone lesions. Small to moderate size calcaneal heel spur noted. IMPRESSION: No acute bony findings or significant degenerative changes. Electronically Signed   By: Marijo Sanes M.D.   On: 09/24/2019 09:04    Procedures Procedures (including critical care time)  Medications Ordered in ED Medications - No data to display  ED Course  I have reviewed the triage vital signs and the nursing notes.  Pertinent labs & imaging results that were available during my care of the patient were reviewed by me and considered in my medical decision making (see chart for details).    MDM Rules/Calculators/A&P                      64 year old male presenting for evaluation of right foot pain ongoing intermittently for the last several weeks.  Stands on his feet for 8 to 10 hours a day.  Pain located  to the forefoot and along the first MTP joint.  No signs of infection on exam.  Symptoms are not consistent with acute gout.  Doubt septic arthritis at this time.  X-ray personally interpreted/reviewed.  No acute bony findings or significant degenerative changes.  Patient may have metatarsalgia, also may have occult stress fracture.  Will give postop shoe and crutches.  Advised NSAIDs and Ortho follow-up for further recommendations and evaluation.  He voices understanding of the plan and reasons to return.  All questions answered.  Patient stable for discharge.  Final Clinical Impression(s) / ED Diagnoses Final diagnoses:  Foot pain, right    Rx / DC Orders ED Discharge Orders         Ordered    naproxen (NAPROSYN) 375 MG tablet  2 times daily     09/24/19 [address], [place], PA-C 09/24/19 1044    Tegeler, Gwenyth Allegra, MD 09/24/19 1606

## 2020-03-03 DEATH — deceased

## 2021-12-19 IMAGING — CR DG FOOT COMPLETE 3+V*R*
3 series · 3 of 3 positions shown · non-contrast
Comparison: None.

CLINICAL DATA: Right foot pain for 1 week. No known trauma.

EXAM:
RIGHT FOOT COMPLETE - 3+ VIEW

[foot ap]
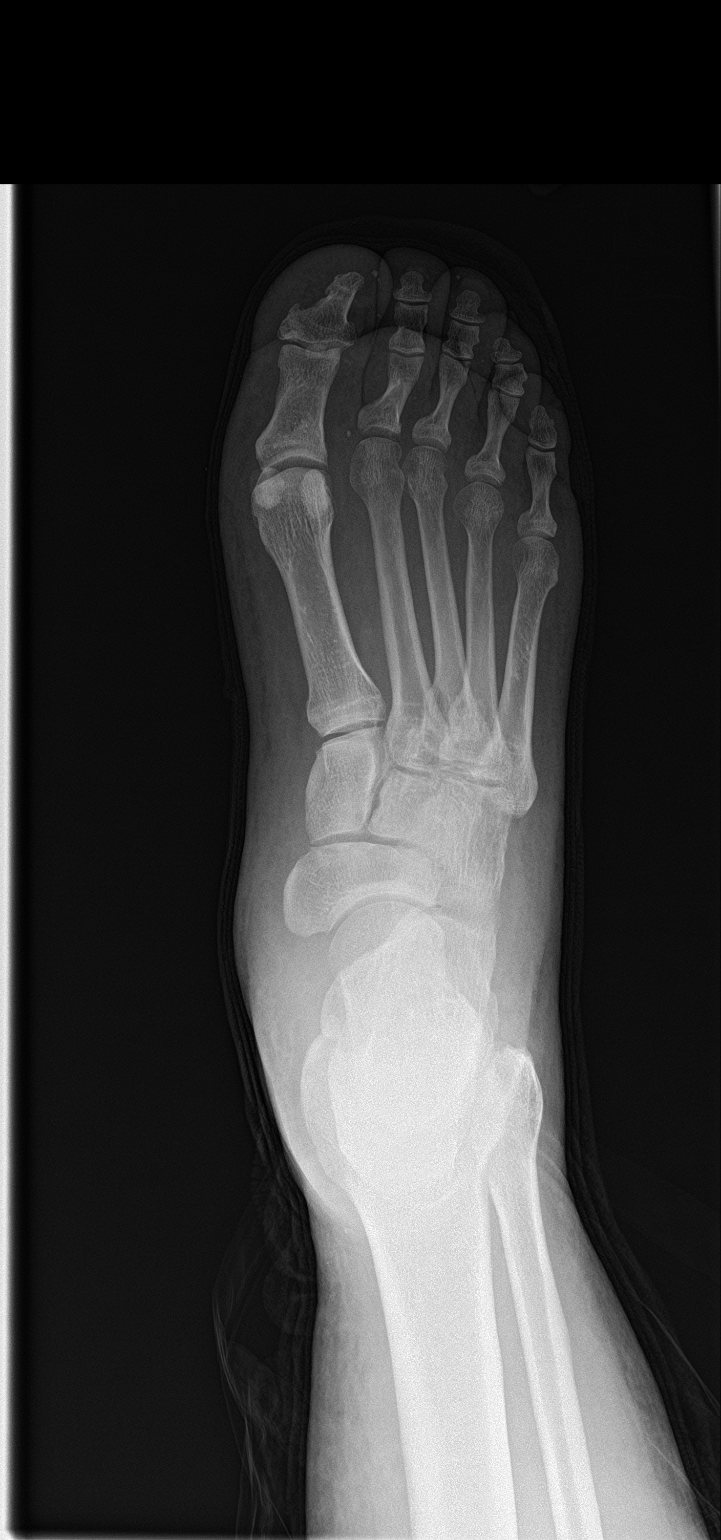

[foot obl]
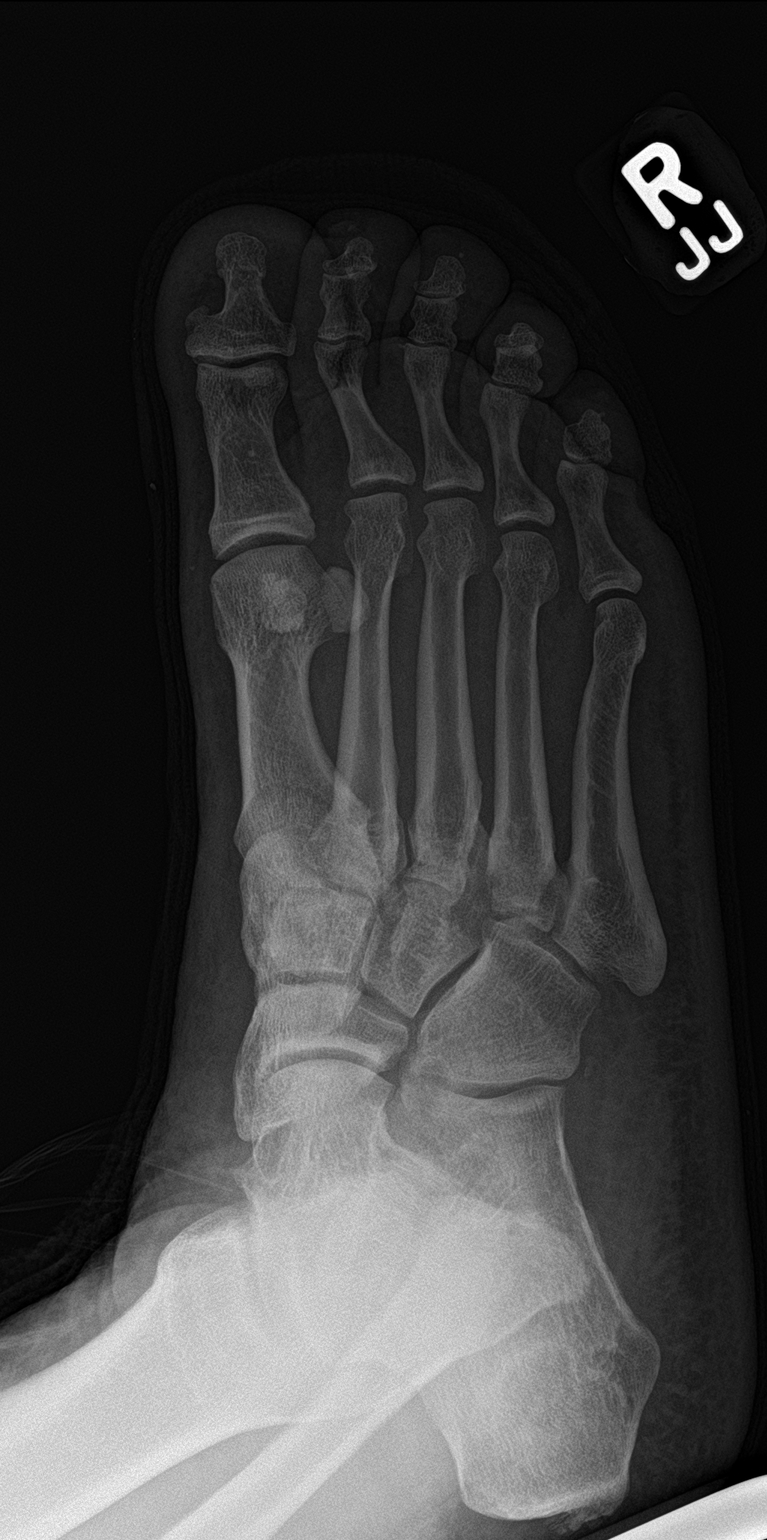

[foot lat]
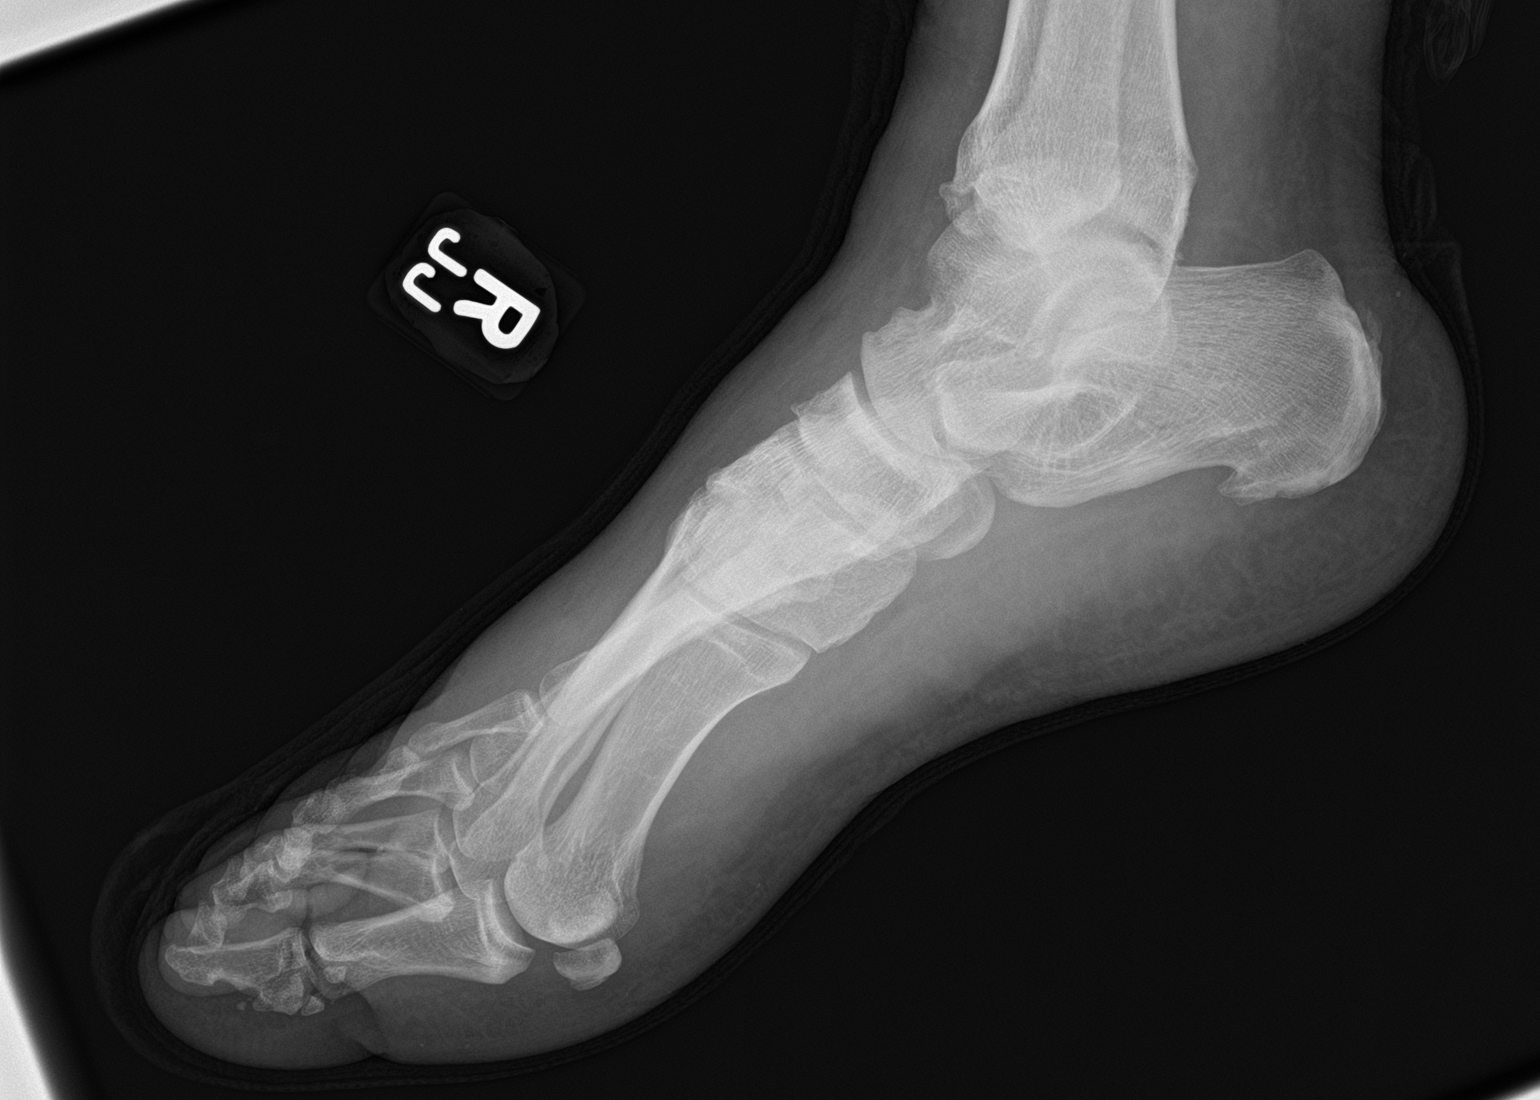

[3 of 3 positions shown; findings below may reference images not displayed]

FINDINGS: The joint spaces are maintained. No acute bony findings. No bone
lesions. Small to moderate size calcaneal heel spur noted.
IMPRESSION: No acute bony findings or significant degenerative changes.
